# Patient Record
Sex: Male | Born: 1975 | Race: Asian | Hispanic: No | Marital: Married | Smoking: Former smoker
Health system: Southern US, Community
[De-identification: ages and names within clinical notes are randomized; demographics above are authoritative.]

---

## 2009-08-10 ENCOUNTER — Emergency Department (HOSPITAL_COMMUNITY): Admission: EM | Admit: 2009-08-10 | Discharge: 2009-08-10 | Payer: Self-pay | Admitting: Emergency Medicine

## 2009-08-22 ENCOUNTER — Emergency Department (HOSPITAL_COMMUNITY): Admission: EM | Admit: 2009-08-22 | Discharge: 2009-08-22 | Payer: Self-pay | Admitting: Emergency Medicine

## 2009-11-02 ENCOUNTER — Emergency Department (HOSPITAL_COMMUNITY): Admission: EM | Admit: 2009-11-02 | Discharge: 2009-11-02 | Payer: Self-pay | Admitting: Emergency Medicine

## 2010-02-22 IMAGING — CR DG LUMBAR SPINE COMPLETE 4+V
5 series · 5 of 5 positions shown · non-contrast
Comparison: None.

CLINICAL DATA: Low back pain and right leg pain.

LUMBAR SPINE - COMPLETE 4+ VIEW

[t l-spine a.p.]
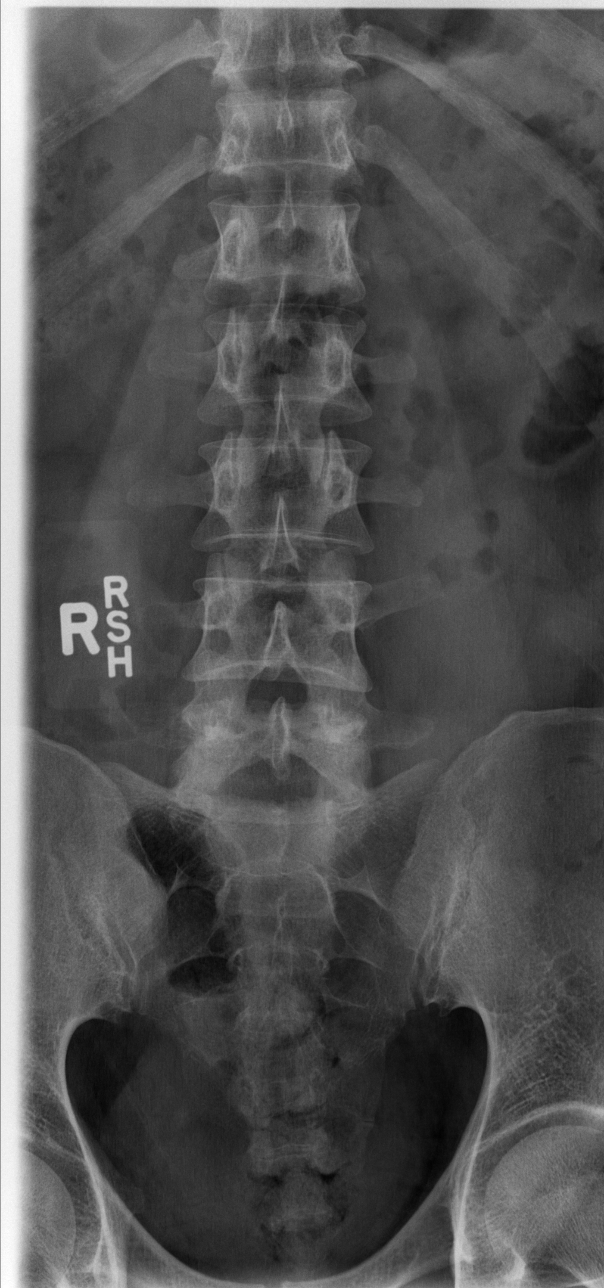

[t l-spine oblique exposure (1 of 2)]
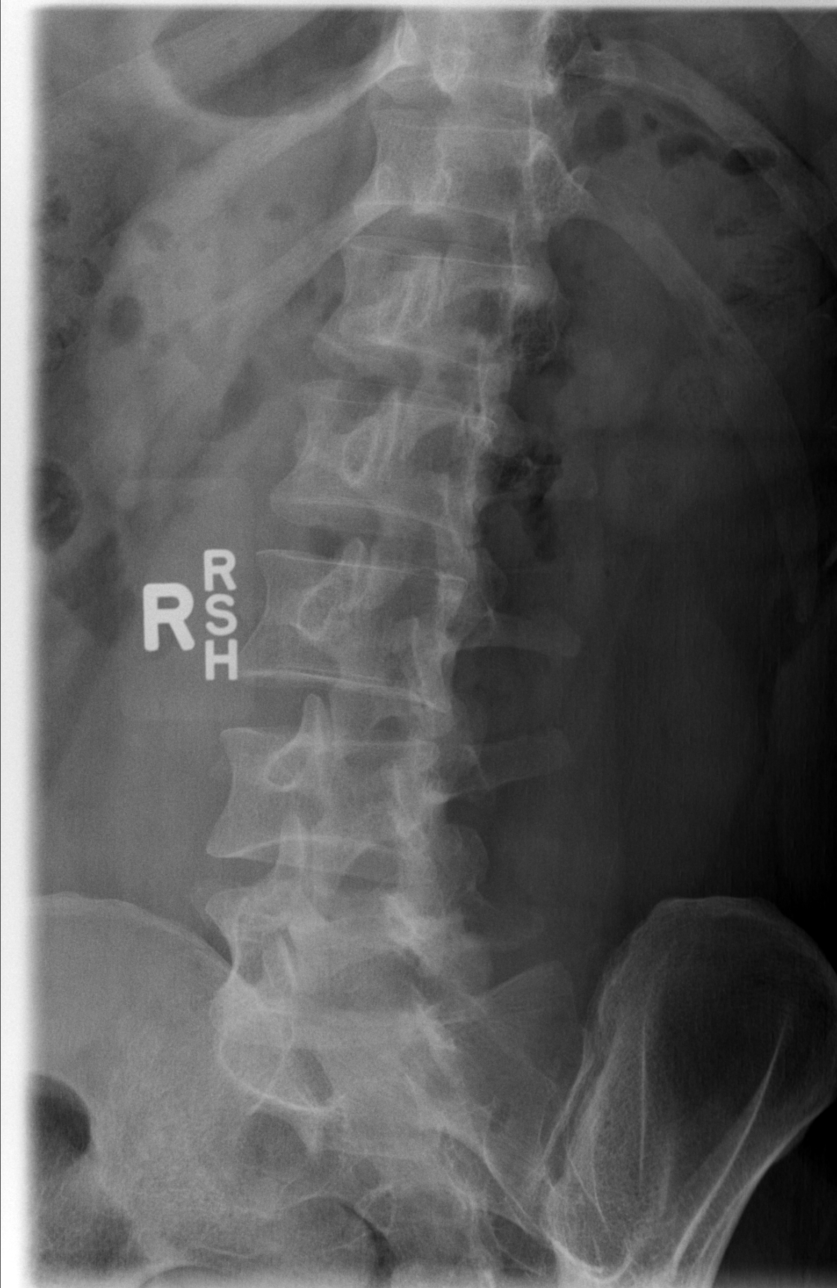

[t l-spine oblique exposure (2 of 2)]
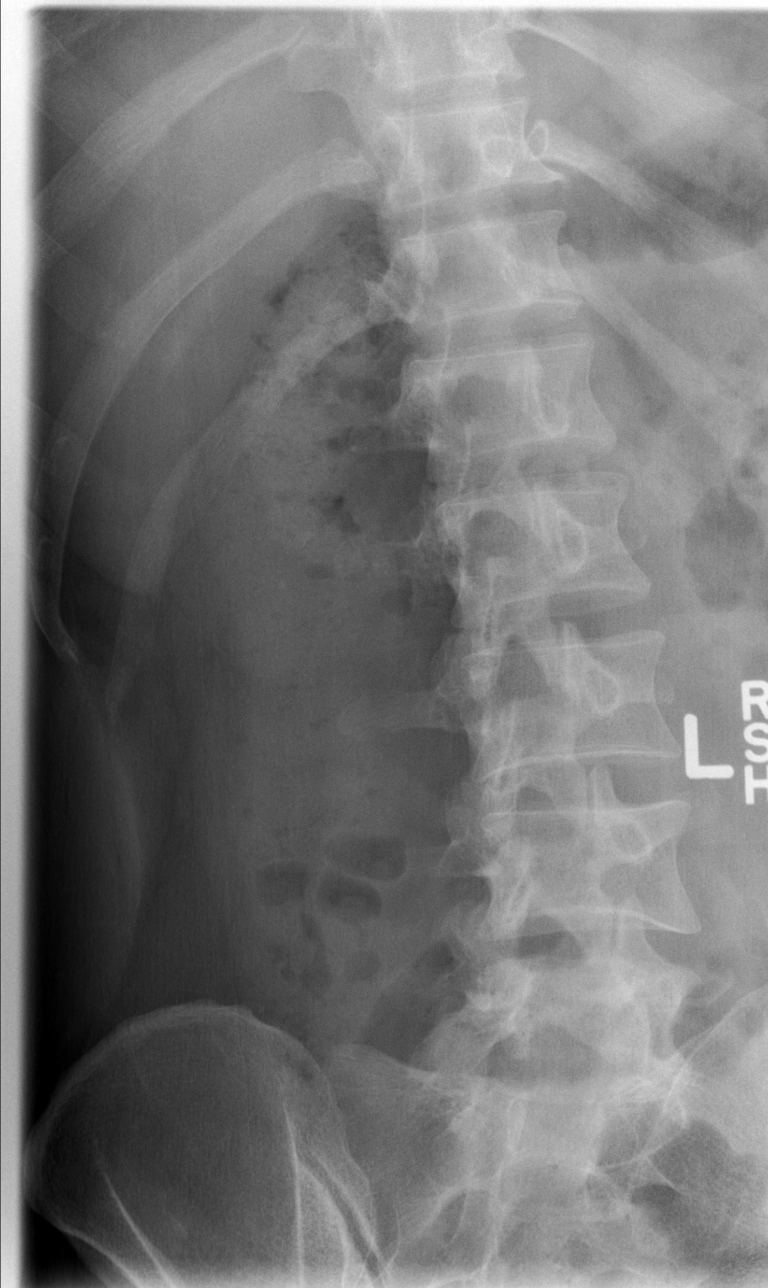

[t l-spine lat]
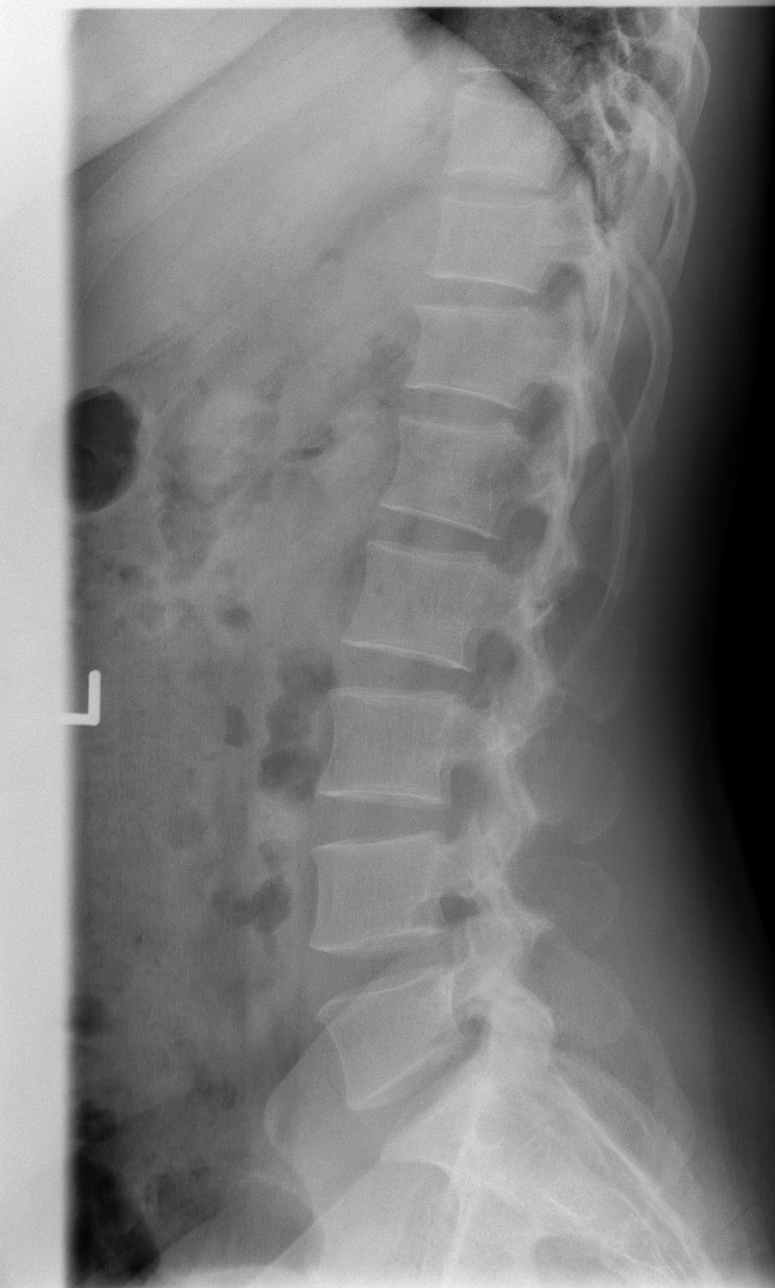

[t l-spine l5-s1 spot]
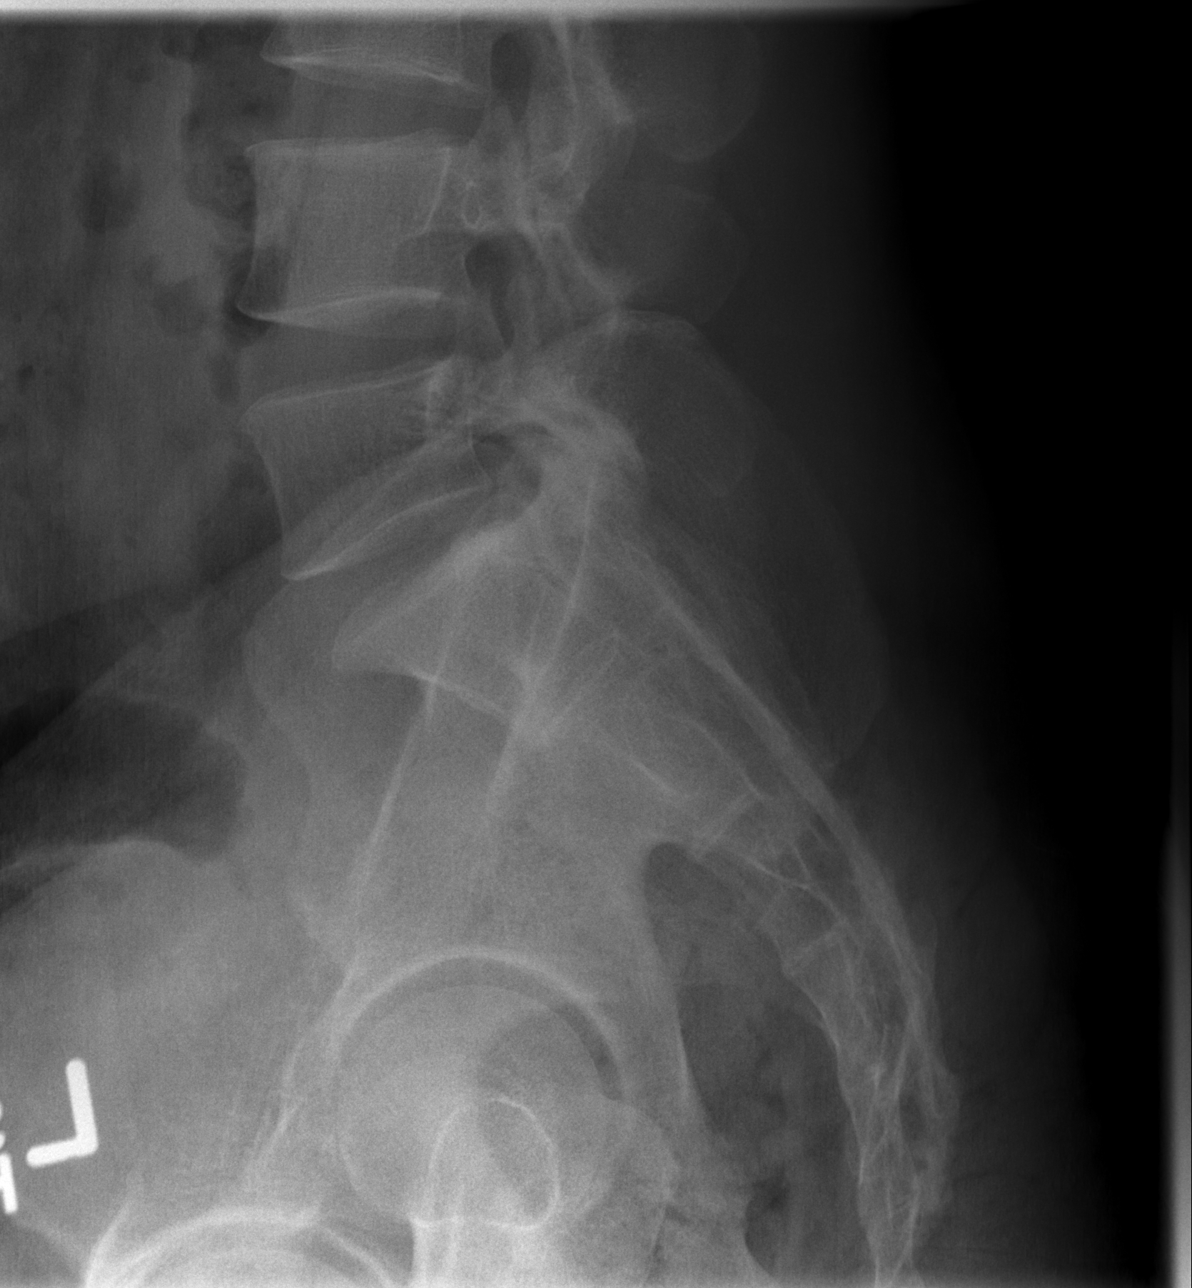

[5 of 5 positions shown; findings below may reference images not displayed]

FINDINGS: Question mild sclerosis in the L5 pars bilaterally.  No
associated alignment abnormality at L5-S1.  There may be slight
loss of disc space height at L5-S1 with facet hypertrophy.
Alignment is anatomic.  Vertebral body height is maintained.
IMPRESSION: Mild L5-S1 spondylosis.  Cannot exclude remote bilateral L5 pars
defects.

## 2016-07-08 ENCOUNTER — Other Ambulatory Visit: Payer: Self-pay | Admitting: Infectious Disease

## 2016-07-08 ENCOUNTER — Ambulatory Visit
Admission: RE | Admit: 2016-07-08 | Discharge: 2016-07-08 | Disposition: A | Discharge status: Home / Self Care | Payer: No Typology Code available for payment source | Source: Ambulatory Visit | Attending: Infectious Disease | Admitting: Infectious Disease

## 2016-07-08 DIAGNOSIS — Z111 Encounter for screening for respiratory tuberculosis: Secondary | ICD-10-CM

## 2019-05-09 ENCOUNTER — Other Ambulatory Visit: Payer: Self-pay

## 2019-05-09 ENCOUNTER — Encounter (HOSPITAL_COMMUNITY): Payer: Self-pay | Admitting: Emergency Medicine

## 2019-05-09 ENCOUNTER — Emergency Department (HOSPITAL_COMMUNITY)
Admission: EM | Admit: 2019-05-09 | Discharge: 2019-05-09 | Disposition: A | Discharge status: Home / Self Care | Payer: BLUE CROSS/BLUE SHIELD | Attending: Emergency Medicine | Admitting: Emergency Medicine

## 2019-05-09 ENCOUNTER — Emergency Department (HOSPITAL_COMMUNITY): Payer: BLUE CROSS/BLUE SHIELD

## 2019-05-09 DIAGNOSIS — R05 Cough: Secondary | ICD-10-CM | POA: Insufficient documentation

## 2019-05-09 DIAGNOSIS — Z87891 Personal history of nicotine dependence: Secondary | ICD-10-CM | POA: Diagnosis not present

## 2019-05-09 DIAGNOSIS — R059 Cough, unspecified: Secondary | ICD-10-CM

## 2019-05-09 DIAGNOSIS — R5383 Other fatigue: Secondary | ICD-10-CM | POA: Diagnosis not present

## 2019-05-09 DIAGNOSIS — R509 Fever, unspecified: Secondary | ICD-10-CM | POA: Insufficient documentation

## 2019-05-09 DIAGNOSIS — Z20822 Contact with and (suspected) exposure to covid-19: Secondary | ICD-10-CM

## 2019-05-09 NOTE — Discharge Instructions (Addendum)
You were seen in the ED for fever, cough.  I suspect you have a virus. It is very likely you have COVID-19 because of your family members who tested positive.  It is also possible you could have other viral upper respiratory infection from another virus.    Treatment of your illness and symptoms will include self-isolation, monitoring of symptoms and supportive care with over-the-counter medicines.    Return to the ED if there is increased work of breathing, shortness of breath, inability to tolerate fluids, weakness, chest pain, worsening abdominal pain, blood in vomit or stool   If you are not able to get a formal COVID test - I recommend full isolation at home from work and family for a total of 10 days since DAY 1 of symptoms before resuming work and essential activities  See below for community resources where you and your family may get tested for [place] to [facility] Department for COVID testing and information: [employer] COVID-19 test available at minimal to no cost Website includes testing location, dates, times, resources  Health department may be able to do "administrative" COVID tests to return/obtain clearance to work  [employer] to [employer] for COVID-19: [email or web address] Website includes contact information and addresses, testing requirements and cost Hours: Monday - Friday, 8 a.m. - 3:30 p.m   Local pharmacies (I.e: [facility], [facility]) also offering COVID testing with some cost    If your test results are POSITIVE, the following isolation requirements need to be met to return to work and resume essential activities: At least 14 days since symptom onset  72 hours of absence of fever without antifever medicine (ibuprofen, acetaminophen). A fever is temperature of 100.11F or greater. Improvement of respiratory symptoms  If your  test is NEGATIVE, you may return to work and essential activities as long as your symptoms have improved and you do not have a fever for a total of 3 days.  Call your job and notify them that your test result was negative to see if they will allow you to return to work.   Stay well-hydrated. Rest. You can use over the counter medications to help with symptoms: 600 mg ibuprofen (motrin, aleve, advil) or acetaminophen (tylenol) every 6 hours, around the clock to help with associated fevers, sore throat, headaches, generalized body aches and malaise.  Oxymetazoline (afrin) intranasal spray once daily for no more than 3 days to help with congestion, after 3 days you can switch to another over-the-counter nasal steroid spray such as fluticasone (flonase) Allergy medication (loratadine, cetirizine, etc) and phenylephrine (sudafed) help with nasal congestion, runny nose and postnasal drip.   Dextromethorphan (Delsym) to suppress dry cough. Frequent coughing is likely causing your chest wall pain Wash your hands often to prevent spread.    Infection Prevention Recommendations for Individuals Confirmed to have, or Being Evaluated for, or have symptoms of 2019 Novel Coronavirus (COVID-19) Infection Who Receive Care at Home  Individuals who are confirmed to have, or are being evaluated for, COVID-19 should follow the prevention steps below until a healthcare provider or local or state health department says they can return to normal activities.  Stay home except to get medical care You should restrict activities outside your home, except for getting medical care. Do not go to work, school, or public areas, and do not use public transportation or taxis.  Call ahead before visiting your doctor Before your medical appointment, call the healthcare provider and tell them that you  have, or are being evaluated for, COVID-19 infection. This will help the healthcare providers office take steps to keep other people  from getting infected. Ask your healthcare provider to call the local or state health department.  Monitor your symptoms Seek prompt medical attention if your illness is worsening (e.g., difficulty breathing). Before going to your medical appointment, call the healthcare provider and tell them that you have, or are being evaluated for, COVID-19 infection. Ask your healthcare provider to call the local or state health department.  Wear a facemask You should wear a facemask that covers your nose and mouth when you are in the same room with other people and when you visit a healthcare provider. People who live with or visit you should also wear a facemask while they are in the same room with you.  Separate yourself from other people in your home As much as possible, you should stay in a different room from other people in your home. Also, you should use a separate bathroom, if available.  Avoid sharing household items You should not share dishes, drinking glasses, cups, eating utensils, towels, bedding, or other items with other people in your home. After using these items, you should wash them thoroughly with soap and water.  Cover your coughs and sneezes Cover your mouth and nose with a tissue when you cough or sneeze, or you can cough or sneeze into your sleeve. Throw used tissues in a lined trash can, and immediately wash your hands with soap and water for at least 20 seconds or use an alcohol-based hand rub.  Wash your [facility] your hands often and thoroughly with soap and water for at least 20 seconds. You can use an alcohol-based hand sanitizer if soap and water are not available and if your hands are not visibly dirty. Avoid touching your eyes, nose, and mouth with unwashed hands.   Prevention Steps for Caregivers and Household Members of Individuals Confirmed to have, or Being Evaluated for, or have symptoms of 2019 Novel Coronavirus (COVID-19) Infection Being Cared for in the  Home  If you live with, or provide care at home for, a person confirmed to have, or being evaluated for, COVID-19 infection please follow these guidelines to prevent infection:  Follow healthcare providers instructions Make sure that you understand and can help the patient follow any healthcare provider instructions for all care.  Provide for the patients basic needs You should help the patient with basic needs in the home and provide support for getting groceries, prescriptions, and other personal needs.  Monitor the patients symptoms If they are getting sicker, call his or her medical provider and tell them that the patient has, or is being evaluated for, COVID-19 infection. This will help the healthcare providers office take steps to keep other people from getting infected. Ask the healthcare provider to call the local or state health department.  Limit the number of people who have contact with the patient If possible, have only one caregiver for the patient. Other household members should stay in another home or place of residence. If this is not possible, they should stay in another room, or be separated from the patient as much as possible. Use a separate bathroom, if available. Restrict visitors who do not have an essential need to be in the home.  Keep older adults, very young children, and other sick people away from the patient Keep older adults, very young children, and those who have compromised immune systems or chronic health  conditions away from the patient. This includes people with chronic heart, lung, or kidney conditions, diabetes, and cancer.  Ensure good ventilation Make sure that shared spaces in the home have good air flow, such as from an air conditioner or an opened window, weather permitting.  Wash your hands often Wash your hands often and thoroughly with soap and water for at least 20 seconds. You can use an alcohol based hand sanitizer if soap and water  are not available and if your hands are not visibly dirty. Avoid touching your eyes, nose, and mouth with unwashed hands. Use disposable paper towels to dry your hands. If not available, use dedicated cloth towels and replace them when they become wet.  Wear a facemask and gloves Wear a disposable facemask at all times in the room and gloves when you touch or have contact with the patients blood, body fluids, and/or secretions or excretions, such as sweat, saliva, sputum, nasal mucus, vomit, urine, or feces.  Ensure the mask fits over your nose and mouth tightly, and do not touch it during use. Throw out disposable facemasks and gloves after using them. Do not reuse. Wash your hands immediately after removing your facemask and gloves. If your personal clothing becomes contaminated, carefully remove clothing and launder. Wash your hands after handling contaminated clothing. Place all used disposable facemasks, gloves, and other waste in a lined container before disposing them with other household waste. Remove gloves and wash your hands immediately after handling these items.  Do not share dishes, glasses, or other household items with the patient Avoid sharing household items. You should not share dishes, drinking glasses, cups, eating utensils, towels, bedding, or other items with a patient who is confirmed to have, or being evaluated for, COVID-19 infection. After the person uses these items, you should wash them thoroughly with soap and water.  Wash laundry thoroughly Immediately remove and wash clothes or bedding that have blood, body fluids, and/or secretions or excretions, such as sweat, saliva, sputum, nasal mucus, vomit, urine, or feces, on them. Wear gloves when handling laundry from the patient. Read and follow directions on labels of laundry or clothing items and detergent. In general, wash and dry with the warmest temperatures recommended on the label.  Clean all areas the  individual has used often Clean all touchable surfaces, such as counters, tabletops, doorknobs, bathroom fixtures, toilets, phones, keyboards, tablets, and bedside tables, every day. Also, clean any surfaces that may have blood, body fluids, and/or secretions or excretions on them. Wear gloves when cleaning surfaces the patient has come in contact with. Use a diluted bleach solution (e.g., dilute bleach with 1 part bleach and 10 parts water) or a household disinfectant with a label that says EPA-registered for coronaviruses. To make a bleach solution at home, add 1 tablespoon of bleach to 1 quart (4 cups) of water. For a larger supply, add  cup of bleach to 1 gallon (16 cups) of water. Read labels of cleaning products and follow recommendations provided on product labels. Labels contain instructions for safe and effective use of the cleaning product including precautions you should take when applying the product, such as wearing gloves or eye protection and making sure you have good ventilation during use of the product. Remove gloves and wash hands immediately after cleaning.  Monitor yourself for signs and symptoms of illness Caregivers and household members are considered close contacts, should monitor their health, and will be asked to limit movement outside of the home to the extent  possible. Follow the monitoring steps for close contacts listed on the symptom monitoring form.  ? If you have additional questions, contact your local health department or call the epidemiologist on call at [phone] (available 24/7). ? This guidance is subject to change. For the most up-to-date guidance from [facility], please refer to their website: [email or web address]

## 2019-05-09 NOTE — ED Provider Notes (Signed)
[facility]   CSN: [id number] Arrival date & time: 05/09/19  0857    History   Chief Complaint Chief Complaint  Patient presents with  . Fever  . Cough  . Abdominal Pain    HPI Douglas Potter is a 43 y.o. male presents to ER for evaluation of fever. Onset 3 days ago. Subjective fever because he has no thermometer. Associated with non productive cough, generalized malaise and intermittent abdominal pain. Abdominal pain is intermittent, last came on yesterday but non today. Abdominal pain is around belly button, mild. Patient's 43 year old son had similar symptoms and tested positive for COVID-19 last week.  Patient lives with his son.  No other sick contacts at home or work.  He denies associated congestion, runny nose, sore throat, sputum, CP, SOB, nausea, vomiting, diarrhea, constipation, dysuria, hematuria, loss of taste/smell. No interventions. No modifying factors. He denies known PMH, tobacco use.      HPI  History reviewed. No pertinent past medical history.  There are no active problems to display for this patient.   History reviewed. No pertinent surgical history.      Home Medications    Prior to Admission medications   Not on File    Family History No family history on file.  Social History Social History   Tobacco Use  . Smoking status: Former Games developermoker  . Smokeless tobacco: Current User  Substance Use Topics  . Alcohol use: Yes  . Drug use: Not Currently     Allergies   Patient has no known allergies.   Review of Systems Review of Systems  Constitutional:       Malaise   Respiratory: Positive for cough.   Gastrointestinal: Positive for abdominal pain (resolved).  All other systems reviewed and are negative.    Physical Exam Updated Vital Signs BP (!) 160/101   Pulse 84   Temp 99.1 F (37.3 C)   Resp 16   SpO2 100%   Physical Exam Vitals signs and nursing note reviewed.   Constitutional:      Appearance: He is well-developed.     Comments: Non toxic.  HENT:     Head: Normocephalic and atraumatic.     Nose: Nose normal.     Mouth/Throat:     Comments: MMM. Oropharynx and tonsils normal  Eyes:     Conjunctiva/sclera: Conjunctivae normal.  Neck:     Musculoskeletal: Normal range of motion.  Cardiovascular:     Rate and Rhythm: Normal rate and regular rhythm.     Heart sounds: Normal heart sounds.  Pulmonary:     Effort: Pulmonary effort is normal.     Breath sounds: Normal breath sounds.  Abdominal:     General: Bowel sounds are normal.     Palpations: Abdomen is soft.     Tenderness: There is no abdominal tenderness.     Comments: No G/R/R. No suprapubic or CVA tenderness. Negative Murphy's and McBurney's  Musculoskeletal: Normal range of motion.  Skin:    General: Skin is warm and dry.     Capillary Refill: Capillary refill takes less than 2 seconds.  Neurological:     Mental Status: He is alert.  Psychiatric:        Behavior: Behavior normal.      ED Treatments / Results  Labs (all labs ordered are listed, but only abnormal results are displayed) Labs Reviewed - No data to display  EKG None  Radiology Dg Chest  Portable 1 View  Result Date: 05/09/2019 CLINICAL DATA:  COUGH.  FEVER.  COVERT EXPOSURE. EXAM: PORTABLE CHEST 1 VIEW COMPARISON:  07/08/2016 FINDINGS: The heart size and mediastinal contours are within normal limits. Both lungs are clear. The visualized skeletal structures are unremarkable. IMPRESSION: Normal exam. Electronically Signed   By: Lorriane Shire M.D.   On: 05/09/2019 10:51    Procedures Procedures (including critical care time)  Medications Ordered in ED Medications - No data to display   Initial Impression / Assessment and Plan / ED Course  I have reviewed the triage vital signs and the nursing notes.  Pertinent labs & imaging results that were available during my care of the patient were reviewed by me  and considered in my medical decision making (see chart for details).  I have reviewed patient's EMR to obtain pertinent PMH to assist in MDM.  Exam is benign.  Normal WOB. No fever, tachypnea, tachycardia, hypoxemia. No increased WOB or other concerning features to exam. Abd non tender.  No significant h/o immunocompromise.   Symptoms and exam most suggestive of uncomplicated viral illness. Highest on DDX includes COVID-19. +COVID19 contact at home.Doubt bacterial bronchitis or pneumonia.  No signs or symptoms to suggest strep pharyngitis.  No clinical signs of severe illness, dehydration, to warrant further emergent work up in ER.   X-ray without evidence of complicating features/PNA from COVID.   Given reassuring physical exam, symptoms, will discharge with symptomatic treatment.  I used vietnamese interpreted and discussed at length regarding likely diagnosis of COVID-19/virus.  Given his language barrier, lack of PCP I considered testing patient for COVID in ER however patient stated that he could seek outpatient COVID testing. I gave patient OP testing resources for him and family to get formal testing.  I discussed patient's overall low risk profile to develop complications.  I don't think emergent formal COVID testing indicated. Pt was given home self-isolation instructions and instructions for family members. He understands signs and symptoms that would warrant return to ED.  Pt comfortable and agreeable with POC.   Final Clinical Impressions(s) / ED Diagnoses   Final diagnoses:  Suspected Covid-19 Virus Infection  Cough    ED Discharge Orders    None       Kinnie Feil, PA-C 05/09/19 [place], [place], DO 05/09/19 1435

## 2019-05-09 NOTE — ED Triage Notes (Addendum)
Sick x 3 days with abd pain fever and cough,  States 43 year old son tested positive, states his parents have being taken care of his son

## 2019-11-09 IMAGING — DX PORTABLE CHEST - 1 VIEW
1 series · 1 of 1 positions shown · non-contrast
Comparison: 07/08/2016

CLINICAL DATA: COUGH.  FEVER.  COVERT EXPOSURE.

EXAM:
PORTABLE CHEST 1 VIEW

[chest]
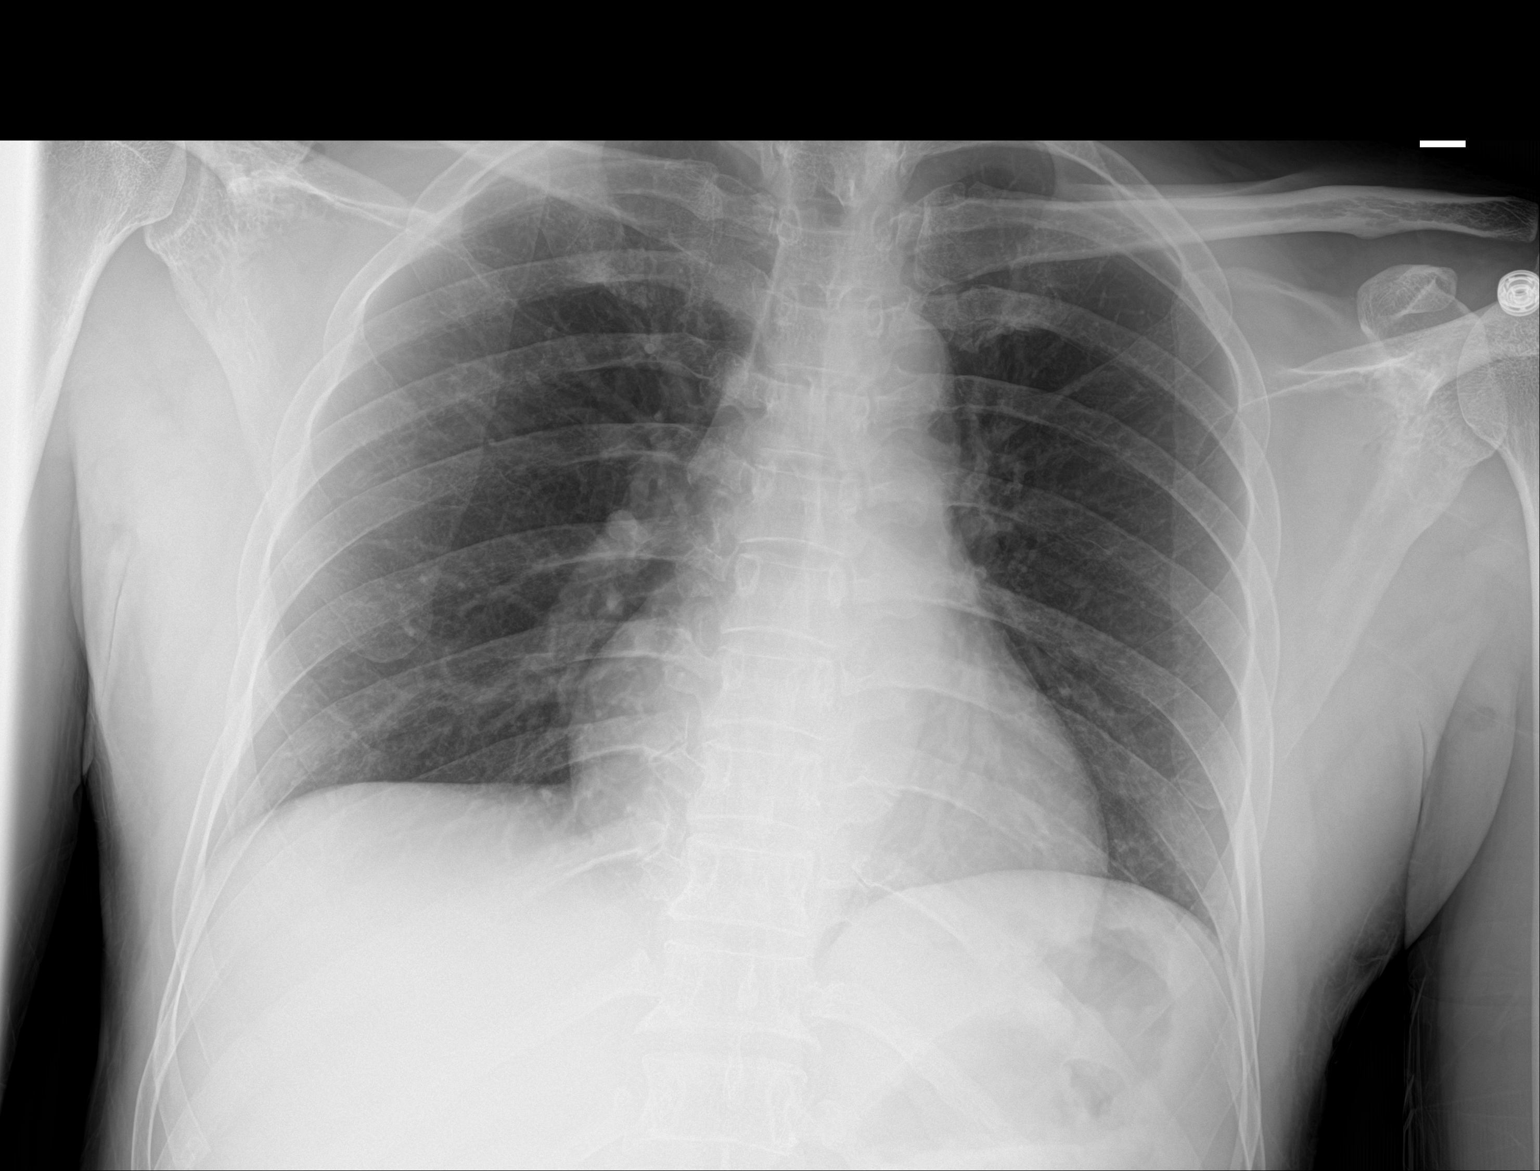

[1 of 1 positions shown; findings below may reference images not displayed]

FINDINGS: The heart size and mediastinal contours are within normal limits.
Both lungs are clear. The visualized skeletal structures are
unremarkable.
IMPRESSION: Normal exam.
